# Patient Record
Sex: Female | Born: 1995 | Race: White | Hispanic: No | Marital: Single | Smoking: Never smoker
Health system: Southern US, Community
[De-identification: ages and names within clinical notes are randomized; demographics above are authoritative.]

## PROBLEM LIST (undated history)

## (undated) DIAGNOSIS — J309 Allergic rhinitis, unspecified: Secondary | ICD-10-CM

## (undated) HISTORY — PX: TONSILLECTOMY: SUR1361

## (undated) HISTORY — PX: ADENOIDECTOMY: SUR15

## (undated) HISTORY — DX: Allergic rhinitis, unspecified: J30.9

---

## 2014-12-07 ENCOUNTER — Inpatient Hospital Stay
Admission: EM | Admit: 2014-12-07 | Discharge: 2014-12-14 | DRG: 200 | Disposition: A | Discharge status: Other Acute Inpatient Hospital | Payer: BLUE CROSS/BLUE SHIELD | Attending: Cardiothoracic Surgery | Admitting: Cardiothoracic Surgery

## 2014-12-07 ENCOUNTER — Emergency Department: Payer: BLUE CROSS/BLUE SHIELD

## 2014-12-07 ENCOUNTER — Encounter: Payer: Self-pay | Admitting: Emergency Medicine

## 2014-12-07 DIAGNOSIS — J9383 Other pneumothorax: Secondary | ICD-10-CM | POA: Diagnosis not present

## 2014-12-07 DIAGNOSIS — J9311 Primary spontaneous pneumothorax: Principal | ICD-10-CM | POA: Insufficient documentation

## 2014-12-07 DIAGNOSIS — J020 Streptococcal pharyngitis: Secondary | ICD-10-CM | POA: Diagnosis present

## 2014-12-07 DIAGNOSIS — J939 Pneumothorax, unspecified: Secondary | ICD-10-CM

## 2014-12-07 DIAGNOSIS — Y838 Other surgical procedures as the cause of abnormal reaction of the patient, or of later complication, without mention of misadventure at the time of the procedure: Secondary | ICD-10-CM | POA: Diagnosis not present

## 2014-12-07 DIAGNOSIS — R509 Fever, unspecified: Secondary | ICD-10-CM

## 2014-12-07 DIAGNOSIS — J95812 Postprocedural air leak: Secondary | ICD-10-CM | POA: Diagnosis not present

## 2014-12-07 DIAGNOSIS — J9811 Atelectasis: Secondary | ICD-10-CM | POA: Diagnosis not present

## 2014-12-07 DIAGNOSIS — Z79899 Other long term (current) drug therapy: Secondary | ICD-10-CM

## 2014-12-07 LAB — CBC
HEMATOCRIT: 42.8 % (ref 35.0–47.0)
HEMOGLOBIN: 14.5 g/dL (ref 12.0–16.0)
MCH: 30.4 pg (ref 26.0–34.0)
MCHC: 33.9 g/dL (ref 32.0–36.0)
MCV: 89.7 fL (ref 80.0–100.0)
Platelets: 344 10*3/uL (ref 150–440)
RBC: 4.77 MIL/uL (ref 3.80–5.20)
RDW: 12.4 % (ref 11.5–14.5)
WBC: 16 10*3/uL — AB (ref 3.6–11.0)

## 2014-12-07 LAB — BASIC METABOLIC PANEL
ANION GAP: 7 (ref 5–15)
BUN: 12 mg/dL (ref 6–20)
CO2: 27 mmol/L (ref 22–32)
Calcium: 9.5 mg/dL (ref 8.9–10.3)
Chloride: 105 mmol/L (ref 101–111)
Creatinine, Ser: 0.75 mg/dL (ref 0.44–1.00)
GFR calc non Af Amer: >60 mL/min (ref >60)
GLUCOSE: 80 mg/dL (ref 65–99)
POTASSIUM: 4.2 mmol/L (ref 3.5–5.1)
Sodium: 139 mmol/L (ref 135–145)

## 2014-12-07 LAB — TROPONIN I: Troponin I: <0.03 ng/mL (ref <0.031)

## 2014-12-07 MED ORDER — LIDOCAINE-EPINEPHRINE (PF) 1 %-1:200000 IJ SOLN
INTRAMUSCULAR | Status: AC
  Administered 2014-12-07: 30 mL via INTRADERMAL
  Filled 2014-12-07: qty 30

## 2014-12-07 MED ORDER — LIDOCAINE-EPINEPHRINE (PF) 1 %-1:200000 IJ SOLN
30.0000 mL | Freq: Once | INTRAMUSCULAR | Status: AC
  Administered 2014-12-07 (×2): 30 mL via INTRADERMAL

## 2014-12-07 MED ORDER — ONDANSETRON HCL 4 MG/2ML IJ SOLN
INTRAMUSCULAR | Status: AC
  Administered 2014-12-07: 4 mg via INTRAVENOUS
  Filled 2014-12-07: qty 2

## 2014-12-07 MED ORDER — MORPHINE SULFATE (PF) 4 MG/ML IV SOLN
4.0000 mg | Freq: Once | INTRAVENOUS | Status: AC
  Administered 2014-12-07: 4 mg via INTRAVENOUS

## 2014-12-07 MED ORDER — MORPHINE SULFATE (PF) 4 MG/ML IV SOLN
INTRAVENOUS | Status: AC
  Administered 2014-12-07: 4 mg via INTRAVENOUS
  Filled 2014-12-07: qty 1

## 2014-12-07 MED ORDER — ONDANSETRON HCL 4 MG/2ML IJ SOLN
4.0000 mg | Freq: Once | INTRAMUSCULAR | Status: AC
  Administered 2014-12-07: 4 mg via INTRAVENOUS

## 2014-12-07 MED ORDER — MORPHINE SULFATE (PF) 2 MG/ML IV SOLN
2.0000 mg | Freq: Once | INTRAVENOUS | Status: AC
  Administered 2014-12-07: 2 mg via INTRAVENOUS

## 2014-12-07 MED ORDER — MORPHINE SULFATE (PF) 2 MG/ML IV SOLN
INTRAVENOUS | Status: AC
  Administered 2014-12-07: 2 mg via INTRAVENOUS
  Filled 2014-12-07: qty 1

## 2014-12-07 NOTE — ED Notes (Signed)
X-ray at bedside

## 2014-12-07 NOTE — ED Provider Notes (Signed)
[facility] Emergency Department Provider Note  ____________________________________________  Time seen: 10:50 PM  I have reviewed the triage vital signs and the nursing notes.   HISTORY  Chief Complaint Chest Pain      HPI Judy Barker is a 19 y.o. female recess acute onset of less shoulder pain and shortness of breath at approximate 4:30 PM while studying. Patient denies any nausea no vomiting. Patient states that current pain score is 7 out of 10 described as sharp. Patient denies any family history of cardiac disease patient also denies any family history of spontaneous pneumothorax.   Past medical history None There are no active problems to display for this patient.   Past surgical history None  Current Outpatient Rx  Name  Route  Sig  Dispense  Refill  . dexmethylphenidate (FOCALIN XR) 10 MG 24 hr capsule   Oral   Take 1 capsule by mouth daily.      0   . methylphenidate 18 MG PO CR tablet   Oral   Take 1 tablet by mouth daily.      0     Allergies No known drug allergies No family history on file.  Social History Social History  Substance Use Topics  . Smoking status: Never Smoker   . Smokeless tobacco: None  . Alcohol Use: Yes    Review of Systems  Constitutional: Negative for fever. Eyes: Negative for visual changes. ENT: Negative for sore throat. Cardiovascular: Positive for chest pain. Respiratory: Positive for shortness of breath. Gastrointestinal: Negative for abdominal pain, vomiting and diarrhea. Genitourinary: Negative for dysuria. Musculoskeletal: Negative for back pain. Skin: Negative for rash. Neurological: Negative for headaches, focal weakness or numbness.   10-point ROS otherwise negative.  ____________________________________________   PHYSICAL EXAM:  VITAL SIGNS: ED Triage Vitals  Enc Vitals Group     BP 12/07/14 2209 146/105 mmHg     Pulse Rate 12/07/14 2209 113     Resp 12/07/14 2209  22     Temp 12/07/14 2209 98.1 F (36.7 C)     Temp Source 12/07/14 2209 Oral     SpO2 12/07/14 2209 99 %     Weight 12/07/14 2209 13 lb (5.897 kg)     Height 12/07/14 2209  (1.727 m)     Head Cir --      Peak Flow --      Pain Score 12/07/14 2210 6     Pain Loc --      Pain Edu? --      Excl. in GC? --      Constitutional: Alert and oriented. Well appearing and in no distress. Eyes: Conjunctivae are normal. PERRL. Normal extraocular movements. ENT   Head: Normocephalic and atraumatic.   Nose: No congestion/rhinnorhea.   Mouth/Throat: Mucous membranes are moist.   Neck: No stridor. Hematological/Lymphatic/Immunilogical: No cervical lymphadenopathy. Cardiovascular: Normal rate, regular rhythm. Normal and symmetric distal pulses are present in all extremities. No murmurs, rubs, or gallops. Respiratory: Normal respiratory effort without tachypnea nor retractions. Breath sounds are clear and equal bilaterally. No wheezes/rales/rhonchi. Gastrointestinal: Soft and nontender. No distention. There is no CVA tenderness. Genitourinary: deferred Musculoskeletal: Nontender with normal range of motion in all extremities. No joint effusions.  No lower extremity tenderness nor edema. Neurologic:  Normal speech and language. No gross focal neurologic deficits are appreciated. Speech is normal.  Skin:  Skin is warm, dry and intact. No rash noted. Psychiatric: Mood and affect are normal. Speech and behavior are  normal. Patient exhibits appropriate insight and judgment.  ____________________________________________     EKG  ED ECG REPORT I, Eligha Kmetz, Colma N, the attending physician, personally viewed and interpreted this ECG.   Date: 12/07/2014  EKG Time: 10:10 PM   Rate: 116  Rhythm: Sinus tachycardia  Axis: None  Intervals: Normal  ST&T Change: None   ____________________________________________    RADIOLOGY    DG Chest 2 View (Final result) Result time:  12/07/14 22:56:54   Final result by Rad Results In Interface (12/07/14 22:56:54)   Narrative:   CLINICAL DATA: Left-sided chest pain, left arm numbness, and shortness of breath.  EXAM: CHEST 2 VIEW  COMPARISON: None.  FINDINGS: Moderate-sized left pneumothorax with mild volume loss in the left lung. Right lung is clear and expanded. Normal heart size and pulmonary vascularity. No blunting of costophrenic angles. Mediastinal contours appear intact.  IMPRESSION: Moderate size left pneumothorax. No tension.  These results were called by telephone at the time of interpretation on 12/07/2014 at 10:53 pm to Dr. Loleta Rose , who verbally acknowledged these results.   Electronically Signed By: Burman Nieves M.D. On: 12/07/2014 22:56         INITIAL IMPRESSION / ASSESSMENT AND PLAN / ED COURSE  Pertinent labs & imaging results that were available during my care of the patient were reviewed by me and considered in my medical decision making (see chart for details).  Patient discussed with Dr. Excell Seltzer general surgeon on call who placed a 28 French chest tube in the left chest cavity.  ____________________________________________   FINAL CLINICAL IMPRESSION(S) / ED DIAGNOSES  Final diagnoses:  Spontaneous pneumothorax      Darci Current, MD 12/07/14 2340

## 2014-12-07 NOTE — ED Notes (Signed)
MD Brown at bedside, completing medical evaluation.  

## 2014-12-07 NOTE — ED Notes (Addendum)
MD Excell Seltzer at bedside placing Chest Tube at this time.

## 2014-12-07 NOTE — ED Notes (Signed)
Pt presents to ED with left sided chest pain and left arm numbness and slight shortness of breath. Reports feeling tight in her shoulders as well. symptoms started around 1630 while studying and have gotten progressively worse. Pt tearful and appears anxious. No hx of the same.

## 2014-12-07 NOTE — ED Notes (Signed)
MD Manson Passey at bedside, updating pt on current plan of care.

## 2014-12-08 ENCOUNTER — Encounter: Payer: Self-pay | Admitting: Emergency Medicine

## 2014-12-08 DIAGNOSIS — J9311 Primary spontaneous pneumothorax: Principal | ICD-10-CM

## 2014-12-08 DIAGNOSIS — J020 Streptococcal pharyngitis: Secondary | ICD-10-CM | POA: Diagnosis present

## 2014-12-08 DIAGNOSIS — Y838 Other surgical procedures as the cause of abnormal reaction of the patient, or of later complication, without mention of misadventure at the time of the procedure: Secondary | ICD-10-CM | POA: Diagnosis not present

## 2014-12-08 DIAGNOSIS — J9811 Atelectasis: Secondary | ICD-10-CM | POA: Diagnosis not present

## 2014-12-08 DIAGNOSIS — J939 Pneumothorax, unspecified: Secondary | ICD-10-CM | POA: Diagnosis present

## 2014-12-08 DIAGNOSIS — J9383 Other pneumothorax: Secondary | ICD-10-CM | POA: Insufficient documentation

## 2014-12-08 DIAGNOSIS — Z79899 Other long term (current) drug therapy: Secondary | ICD-10-CM | POA: Diagnosis not present

## 2014-12-08 DIAGNOSIS — J95812 Postprocedural air leak: Secondary | ICD-10-CM | POA: Diagnosis not present

## 2014-12-08 MED ORDER — MORPHINE SULFATE (PF) 2 MG/ML IV SOLN
2.0000 mg | INTRAVENOUS | Status: DC | PRN
  Administered 2014-12-08 – 2014-12-10 (×9): 2 mg via INTRAVENOUS
  Filled 2014-12-08 (×9): qty 1

## 2014-12-08 MED ORDER — ONDANSETRON HCL 4 MG PO TABS: 4.0000 mg | ORAL_TABLET | Freq: Four times a day (QID) | ORAL | Status: DC | PRN

## 2014-12-08 MED ORDER — SODIUM CHLORIDE 0.9 % IJ SOLN: 3.0000 mL | INTRAMUSCULAR | Status: DC | PRN

## 2014-12-08 MED ORDER — SODIUM CHLORIDE 0.9 % IJ SOLN
3.0000 mL | Freq: Two times a day (BID) | INTRAMUSCULAR | Status: DC
  Administered 2014-12-08 – 2014-12-13 (×12): 3 mL via INTRAVENOUS

## 2014-12-08 MED ORDER — HYDROCODONE-ACETAMINOPHEN 5-325 MG PO TABS
1.0000 | ORAL_TABLET | ORAL | Status: DC | PRN
  Administered 2014-12-08: 1 via ORAL
  Administered 2014-12-09: 2 via ORAL
  Administered 2014-12-09 (×2): 1 via ORAL
  Administered 2014-12-10 (×2): 2 via ORAL
  Administered 2014-12-11 (×2): 1 via ORAL
  Administered 2014-12-11 (×2): 2 via ORAL
  Administered 2014-12-12: 1 via ORAL
  Administered 2014-12-12 (×2): 2 via ORAL
  Administered 2014-12-12 – 2014-12-13 (×3): 1 via ORAL
  Filled 2014-12-08: qty 1
  Filled 2014-12-08 (×2): qty 2
  Filled 2014-12-08 (×3): qty 1
  Filled 2014-12-08: qty 2
  Filled 2014-12-08: qty 1
  Filled 2014-12-08 (×2): qty 2
  Filled 2014-12-08 (×3): qty 1
  Filled 2014-12-08 (×2): qty 2
  Filled 2014-12-08: qty 1
  Filled 2014-12-08: qty 2
  Filled 2014-12-08: qty 1

## 2014-12-08 MED ORDER — SODIUM CHLORIDE 0.9 % IV SOLN: 250.0000 mL | INTRAVENOUS | Status: DC | PRN

## 2014-12-08 MED ORDER — ONDANSETRON HCL 4 MG/2ML IJ SOLN
4.0000 mg | Freq: Four times a day (QID) | INTRAMUSCULAR | Status: DC | PRN
  Administered 2014-12-08: 4 mg via INTRAVENOUS
  Filled 2014-12-08: qty 2

## 2014-12-08 NOTE — Progress Notes (Signed)
Patient ID: Judy Barker, female   DOB: Dec 05, 1995, 19 y.o.   MRN: [id number]  Chief Complaint  Patient presents with  . Chest Pain    Referred By Dr. Egbert Garibaldi  Reason for Referral Management of left spontaneous pneumothorax  HPI Location, Quality, Duration, Severity, Timing, Context, Modifying Factors, Associated Signs and Symptoms.  Judy Barker is a 19 y.o. female.  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  No strenuous physical exertion.  Went about her usual routine until the pain became severe and she went to the ER where a CXRay showed a moderate sized left pneumothorax which resolved after chest tube insertion.  Today, she is not short of breath and there is almost no pain.  Never had this before.  Nonsmoker.  No family history of pneumothorax.  Lat menses was about 2 weeks ago.  Regular periods.   History reviewed. No pertinent past medical history.  History reviewed. No pertinent past surgical history.  History reviewed. No pertinent family history.  Social History Social History  Substance Use Topics  . Smoking status: Never Smoker   . Smokeless tobacco: None  . Alcohol Use: 1.8 - 3.0 oz/week    2-3 Glasses of wine, 1-2 Cans of beer, 0 Shots of liquor per week    No Known Allergies  Current Facility-Administered Medications  Medication Dose Route Frequency Provider Last Rate Last Dose  . 0.9 %  sodium chloride infusion  250 mL Intravenous PRN Lattie Haw, MD      . HYDROcodone-acetaminophen (NORCO/VICODIN) 5-325 MG per tablet 1-2 tablet  1-2 tablet Oral Q4H PRN Lattie Haw, MD      . morphine 2 MG/ML injection 2 mg  2 mg Intravenous Q2H PRN Lattie Haw, MD   2 mg at 12/08/14 0837  . ondansetron (ZOFRAN) tablet 4 mg  4 mg Oral Q6H PRN Lattie Haw, MD       Or  . ondansetron [facility]) injection 4 mg  4 mg Intravenous Q6H PRN Lattie Haw, MD      . sodium  chloride 0.9 % injection 3 mL  3 mL Intravenous Q12H Lattie Haw, MD   3 mL at 12/08/14 0248  . sodium chloride 0.9 % injection 3 mL  3 mL Intravenous PRN Lattie Haw, MD          Review of Systems A complete review of systems was asked and was negative except for the following positive findings some pain at chest tube insertion site.  Blood pressure 142/89, pulse 101, temperature 98.8 F (37.1 C), temperature source Oral, resp. rate 20, height  (1.727 m), weight 137 lb 6.4 oz (62.324 kg), last menstrual period 11/13/2014, SpO2 100 %.  Physical Exam CONSTITUTIONAL:  Pleasant, well-developed, well-nourished, and in no acute distress. EYES: Pupils equal and reactive to light, Sclera non-icteric EARS, NOSE, MOUTH AND THROAT:  The oropharynx was clear.  Dentition is good repair.  Oral mucosa pink and moist. LYMPH NODES:  Lymph nodes in the neck and axillae were normal RESPIRATORY:  Lungs were clear.  Normal respiratory effort without pathologic use of accessory muscles of respiration CARDIOVASCULAR: Heart was regular without murmurs.  There were no carotid bruits. GI: The abdomen was soft, nontender, and nondistended. There were no palpable masses. There was no hepatosplenomegaly. There were normal bowel sounds in all quadrants. Musculoskeletal:  No clubbing or cyanosis.  SKIN:  There were no pathologic skin lesions.  There were no nodules on palpation. NEUROLOGIC:  Sensation is normal.  Cranial nerves are grossly intact. PSYCH:  Oriented to person, place and time.  Mood and affect are normal.  Data Reviewed CXRays  I have personally reviewed the patient's imaging, laboratory findings and medical records.    Assessment    Left spontaneous pneumothorax      Plan    There is no air leak today.  Will leave on suction today and repeat CXRay in the morning.  If there is no air leak and no pneumothorax, we will place the tube to water seal.  May removed chest tube tomorrow.           Hulda Marinimothy Haru Anspaugh, MD 12/08/2014, 11:48 AM

## 2014-12-08 NOTE — Op Note (Signed)
*   No surgery found *  12:09 AM  PATIENT:  Judy Barker  19 y.o. female  PRE-OPERATIVE DIAGNOSIS:  Left pneumothorax  POST-OPERATIVE DIAGNOSIS:  Same  PROCEDURE: Left chest tube placement  SURGEON:  Lattie Hawichard E Cooper MD, FACS   ANESTHESIA:   local   Details of Procedure: This patient with findings of a left pneumothorax spontaneous in nature. Wires emergency chest tube placement on the left.  Informed consent was obtained while discussing the risks of bleeding infection recurrence the need for further surgery and also the placement of the tube itself. This is all reviewed for her she understood and agreed to proceed.  A time out was held. She was then prepped and draped in sterile fashion local anesthetic was infiltrated into the skin and subcutaneous tissues tissues around the left chest wall near the anterior to midaxillary line on the left. The local anesthetic needle was advanced into the pleural cavity signifying and identifying the presence of air in the pleural cavity.  An incision was made and blunt dissection with a hemostat was performed open the chest then the trocar 20 French catheter was placed easily and advanced after confirming that there was air in the pleural cavity. The chest tube was advanced and then sewn in with the provided silk suture and a sterile dressing was placed  Tube was placed to 20 cm of negative suction and there was no obvious air leak.  Postprocedure chest film demonstrated good inflation of the lung on the left with a chest tube in proper position. She was left in stable condition in the emergency room.   Lattie Hawichard E Cooper, MD FACS

## 2014-12-08 NOTE — H&P (Signed)
Judy Barker is an 19 y.o. female.    Chief Complaint: Left chest pain  HPI: This patient experienced the acute onset of left chest pain while studying there is no history of trauma or exertion she's had left chest and left neck pain as well as some shortness of breath. In the emergency room showed a considerable sized left pneumothorax and I was asked see the patient for chest tube placement. I discussed the patient directly with Dr. Owens Shark.  History reviewed. No pertinent past medical history.  History reviewed. No pertinent past surgical history.  No family history on file. Social History:  reports that she has never smoked. She does not have any smokeless tobacco history on file. She reports that she drinks alcohol. She reports that she does not use illicit drugs.  Allergies: No Known Allergies   (Not in a hospital admission)   Review of Systems  Constitutional: Negative.   HENT: Negative.   Eyes: Negative.   Respiratory: Positive for shortness of breath. Negative for cough, hemoptysis, sputum production and wheezing.   Cardiovascular: Positive for chest pain. Negative for palpitations, orthopnea, claudication and leg swelling.  Gastrointestinal: Negative.   Genitourinary: Negative.   Musculoskeletal: Negative.   Skin: Negative.   Neurological: Negative.   Endo/Heme/Allergies: Negative.   Psychiatric/Behavioral: Negative.      Physical Exam:  BP 169/94 mmHg  Pulse 112  Temp(Src) 98.1 F (36.7 C) (Oral)  Resp 14  Ht 5' 8"  (1.727 m)  Wt 13 lb (5.897 kg)  BMI 1.98 kg/m2  SpO2 100%  LMP 11/13/2014  Physical Exam  Constitutional: She is oriented to person, place, and time and well-developed, well-nourished, and in no distress. No distress.  HENT:  Head: Normocephalic and atraumatic.  Eyes: Pupils are equal, round, and reactive to light. No scleral icterus.  Neck: Normal range of motion.  Cardiovascular: Normal rate, regular rhythm and normal heart sounds.    Pulmonary/Chest: Effort normal. No respiratory distress. She has no wheezes. She has no rales. She exhibits no tenderness.  Near absent left breath sounds  Abdominal: Soft. She exhibits no distension. There is no tenderness.  Musculoskeletal: Normal range of motion. She exhibits no edema.  Lymphadenopathy:    She has no cervical adenopathy.  Neurological: She is alert and oriented to person, place, and time.  Skin: Skin is warm and dry.  Psychiatric: Mood and affect normal.        Results for orders placed or performed during the hospital encounter of 12/07/14 (from the past 48 hour(s))  CBC     Status: Abnormal   Collection Time: 12/07/14 10:34 PM  Result Value Ref Range   WBC 16.0 (H) 3.6 - 11.0 K/uL   RBC 4.77 3.80 - 5.20 MIL/uL   Hemoglobin 14.5 12.0 - 16.0 g/dL   HCT 42.8 35.0 - 47.0 %   MCV 89.7 80.0 - 100.0 fL   MCH 30.4 26.0 - 34.0 pg   MCHC 33.9 32.0 - 36.0 g/dL   RDW 12.4 11.5 - 14.5 %   Platelets 344 150 - 440 K/uL  Basic metabolic panel     Status: None   Collection Time: 12/07/14 10:34 PM  Result Value Ref Range   Sodium 139 135 - 145 mmol/L   Potassium 4.2 3.5 - 5.1 mmol/L   Chloride 105 101 - 111 mmol/L   CO2 27 22 - 32 mmol/L   Glucose, Bld 80 65 - 99 mg/dL   BUN 12 6 - 20 mg/dL  Creatinine, Ser 0.75 0.44 - 1.00 mg/dL   Calcium 9.5 8.9 - 10.3 mg/dL   GFR calc non Af Amer >60 >60 mL/min   GFR calc Af Amer >60 >60 mL/min    Comment: (NOTE) The eGFR has been calculated using the CKD EPI equation. This calculation has not been validated in all clinical situations. eGFR's persistently <60 mL/min signify possible Chronic Kidney Disease.    Anion gap 7 5 - 15  Troponin I     Status: None   Collection Time: 12/07/14 10:34 PM  Result Value Ref Range   Troponin I <0.03 <0.031 ng/mL    Comment:        NO INDICATION OF MYOCARDIAL INJURY.    Dg Chest 2 View  12/07/2014   CLINICAL DATA:  Left-sided chest pain, left arm numbness, and shortness of breath.   EXAM: CHEST  2 VIEW  COMPARISON:  None.  FINDINGS: Moderate-sized left pneumothorax with mild volume loss in the left lung. Right lung is clear and expanded. Normal heart size and pulmonary vascularity. No blunting of costophrenic angles. Mediastinal contours appear intact.  IMPRESSION: Moderate size left pneumothorax.  No tension.  These results were called by telephone at the time of interpretation on 12/07/2014 at 10:53 pm to Dr. Hinda Kehr , who verbally acknowledged these results.   Electronically Signed   By: Lucienne Capers M.D.   On: 12/07/2014 22:56   Dg Chest Portable 1 View  12/07/2014   CLINICAL DATA:  Chest tube placement.  EXAM: PORTABLE CHEST 1 VIEW  COMPARISON:  Chest radiograph December 07, 2014 at 2241 hours.  FINDINGS: Interval placement of LEFT chest tube, distal tip projecting and apex, side port within the chest wall. Tiny residual LEFT apical pneumothorax, improved. No mediastinal shift shift. Cardiomediastinal silhouette is normal. No pleural effusion focal consolidation. Soft tissue planes and included osseous structures are nonsuspicious.  IMPRESSION: Interval placement of LEFT chest tube, tiny residual LEFT apical pneumothorax without mediastinal shift.   Electronically Signed   By: Elon Alas M.D.   On: 12/07/2014 23:58     Assessment/Plan  Chest x-rays personally reviewed. Considerable size left pneumothorax. He requires emergency chest tube placement C separate dictation. Patient will be admitted to the hospital post chest tube placement.  Florene Glen, MD, FACS

## 2014-12-09 ENCOUNTER — Inpatient Hospital Stay: Payer: BLUE CROSS/BLUE SHIELD

## 2014-12-09 LAB — CBC
HCT: 39.9 % (ref 35.0–47.0)
HEMOGLOBIN: 13.3 g/dL (ref 12.0–16.0)
MCH: 30.5 pg (ref 26.0–34.0)
MCHC: 33.2 g/dL (ref 32.0–36.0)
MCV: 91.7 fL (ref 80.0–100.0)
PLATELETS: 239 10*3/uL (ref 150–440)
RBC: 4.36 MIL/uL (ref 3.80–5.20)
RDW: 12.4 % (ref 11.5–14.5)
WBC: 21.1 10*3/uL — ABNORMAL HIGH (ref 3.6–11.0)

## 2014-12-09 MED ORDER — DEXTROSE 5 % IV SOLN
1.5000 g | Freq: Two times a day (BID) | INTRAVENOUS | Status: DC
  Administered 2014-12-09 – 2014-12-12 (×6): 1.5 g via INTRAVENOUS
  Filled 2014-12-09 (×8): qty 1.5

## 2014-12-09 MED ORDER — ACETAMINOPHEN 325 MG PO TABS
650.0000 mg | ORAL_TABLET | ORAL | Status: DC | PRN
  Administered 2014-12-09 – 2014-12-10 (×3): 650 mg via ORAL
  Filled 2014-12-09 (×4): qty 2

## 2014-12-09 MED ORDER — MENTHOL 3 MG MT LOZG
1.0000 | LOZENGE | OROMUCOSAL | Status: DC | PRN
  Administered 2014-12-10: 3 mg via ORAL
  Filled 2014-12-09: qty 9

## 2014-12-09 MED ORDER — CEPHALEXIN 250 MG PO CAPS
250.0000 mg | ORAL_CAPSULE | Freq: Three times a day (TID) | ORAL | Status: DC
  Administered 2014-12-09: 250 mg via ORAL
  Filled 2014-12-09: qty 1

## 2014-12-09 NOTE — Progress Notes (Signed)
[facility] Post-Op Note  Patient ID: Judy Barker, female   DOB: 1996-02-16, 19 y.o.   MRN: [id number]  HISTORY: Some apin at chest tube site.  Not short of breath.  Coughing some this morning.  No fever.  Walking in halls.   Filed Vitals:   12/09/14 0830  BP: 120/63  Pulse: 86  Temp: 98.9 F (37.2 C)  Resp: 16     EXAM: Resp: Lungs are clear bilaterally.  No respiratory distress, normal effort. Heart:  Regular without murmurs Skin: Skin is warm and dry. No rash noted. No diaphoretic. No erythema. No pallor.  Psychiatric: Normal mood and affect. Normal behavior. Judgment and thought content normal.    ASSESSMENT: Independent review of CXray shows a small apical pneumothorax that is slightly larger.  There is a small air leak with cough   PLAN:   Will leave on water seal today.   Repeat CXRay in the morning    Hulda Marinimothy Shawndell Schillaci, MD

## 2014-12-10 ENCOUNTER — Inpatient Hospital Stay: Payer: BLUE CROSS/BLUE SHIELD

## 2014-12-10 DIAGNOSIS — J939 Pneumothorax, unspecified: Secondary | ICD-10-CM

## 2014-12-10 LAB — URINALYSIS COMPLETE WITH MICROSCOPIC (ARMC ONLY)
BACTERIA UA: NONE SEEN
BILIRUBIN URINE: NEGATIVE
GLUCOSE, UA: NEGATIVE mg/dL
Hgb urine dipstick: NEGATIVE
Ketones, ur: NEGATIVE mg/dL
Leukocytes, UA: NEGATIVE
NITRITE: NEGATIVE
PH: 6 (ref 5.0–8.0)
PROTEIN: NEGATIVE mg/dL
Specific Gravity, Urine: 1.005 (ref 1.005–1.030)

## 2014-12-10 NOTE — Consult Note (Signed)
Medical Consultation  Earl ManyMorgan Sappenfield ZOX:[id number] DOB: February 16, 1996 DOA: 12/07/2014 PCP: No PCP Per Patient   Requesting physician: Dr Thelma Bargeoaks Date of consultation: 12/10/2014  Reason for consultation: fver  Impression/Recommendations 1. Fever: Possibly from atelectasis vs PTX vs PNA vs Strep throat Continue current broad spectrum antibiotics for now Follow up on blood cx Check UA with Ucx Follow up on strep throat cx If still with persistent fever, needs ID consult and CT scan CHEST ISS for Atelectasis   2. Sponataneous PTX: per surgery It appears from today's XRAY unchanged/increaed slightly    Chief Complaint: fever  HPI:  19 year- old female who presented on October 12 with chest pain and found to have pneumothorax. Hospital service was consulted due to persistent fever. Patient had a temperature 100.7 yesterday. She is a temperature of 99.5 currently. Patient has a cough. She reports approximate 2 weeks ago she had a URI with symptoms of cough, congestion and runny nose. She does report that she gets strep throat very often and does not have classical symptoms. Should the culture has been ordered and is pending at this time. She has no urinary symptoms. Patient currently has a chest tube laced for her pneumothorax.  Review of Systems  Constitutional: ++ fever, chills  No weight loss HENT: Negative for ear pain, nosebleeds, congestion, facial swelling, rhinorrhea, neck pain, neck stiffness and ear discharge.   Respiratory: ++ cough, shortness of breath, NOwheezing  Cardiovascular: Negative for chest pain, palpitations and leg swelling.  Gastrointestinal: Negative for heartburn, abdominal pain, vomiting, diarrhea or consitpation Genitourinary: Negative for dysuria, urgency, frequency, hematuria Musculoskeletal: Negative for back pain or joint pain Neurological: Negative for dizziness, seizures, syncope, focal weakness,  numbness and headaches.  Hematological: Does not  bruise/bleed easily.  Psychiatric/Behavioral: Negative for hallucinations, confusion, dysphoric mood    PMHX: none P Surgical Hx: none  Social History:  reports that she has never smoked. She does not have any smokeless tobacco history on file. She reports that she drinks about 1.8 - 3.0 oz of alcohol per week. She reports that she does not use illicit drugs.  No Known Allergies   FAMILY HX: no CAD.HTN  Prior to Admission medications   Medication Sig Start Date End Date Taking? Authorizing Provider  dexmethylphenidate (FOCALIN XR) 10 MG 24 hr capsule Take 1 capsule by mouth daily. 12/03/14  Yes Historical Provider, MD  methylphenidate 18 MG PO CR tablet Take 1 tablet by mouth daily. 11/19/14  Yes Historical Provider, MD    Physical Exam: Blood pressure 119/67, pulse 94, temperature 99.5 F (37.5 C), temperature source Oral, resp. rate 16, height 5\' 8"  (1.727 m), weight 62.324 kg (137 lb 6.4 oz), last menstrual period 11/13/2014, SpO2 98 %. @VITALS2 @ [facility]   12/07/14 2209 12/08/14 0058  Weight: 5.897 kg (13 lb) 62.324 kg (137 lb 6.4 oz)    Intake/Output Summary (Last 24 hours) at 12/10/14 1120 Last data filed at 12/10/14 0830  Gross per 24 hour  Intake    720 ml  Output      0 ml  Net    720 ml     Constitutional: Appears well-developed and well-nourished. No distress. HENT: Normocephalic. Marland Kitchen. Oropharynx is clear and moist.  Eyes: Conjunctivae and EOM are normal. PERRLA, no scleral icterus.  Neck: Normal ROM. Neck supple. No JVD. No tracheal deviation. CVS: RRR, S1/S2 +, no murmurs, no gallops, no carotid bruit.  Pulmonary: Effort and breath sounds normal, no stridor, rhonchi, wheezes, rales. She has chest  tube left chest wall Abdominal: Soft. BS +,  no distension, tenderness, rebound or guarding.  Musculoskeletal: Normal range of motion. No edema and no tenderness.  Neuro: Alert. CN 2-12 grossly intact. No focal deficits. Skin: Skin is warm and dry. No rash  noted. Psychiatric: Normal mood and affect.    Labs  Basic Metabolic Panel:  Recent Labs Lab 12/07/14 2234  NA 139  K 4.2  CL 105  CO2 27  GLUCOSE 80  BUN 12  CREATININE 0.75  CALCIUM 9.5   Liver Function Tests: No results for input(s): AST, ALT, ALKPHOS, BILITOT, PROT, ALBUMIN in the last 168 hours. No results for input(s): LIPASE, AMYLASE in the last 168 hours.  CBC:  Recent Labs Lab 12/09/14 1710  WBC 21.1*  HGB 13.3  HCT 39.9  MCV 91.7  PLT 239   Cardiac Enzymes:  Recent Labs Lab 12/07/14 2234  TROPONINI <0.03   BNP: Invalid input(s): POCBNP CBG: No results for input(s): GLUCAP in the last 168 hours.  Radiological Exams: Dg Chest 2 View  12/10/2014  CLINICAL DATA:  Left-sided pneumothorax. EXAM: CHEST  2 VIEW COMPARISON:  December 09, 2014. FINDINGS: The heart size and mediastinal contours are within normal limits. Right lung is clear. Left-sided chest tube is again noted and unchanged in position. Mild left apical pneumothorax is noted which is increased slightly in size. The visualized skeletal structures are unremarkable. IMPRESSION: Left-sided chest tube is unchanged in position. Left apical pneumothorax has increased slightly in size since prior exam. Electronically Signed   By: Lupita Raider, M.D.   On: 12/10/2014 10:03   Dg Chest 2 View  12/09/2014  CLINICAL DATA:  Fever. EXAM: CHEST  2 VIEW COMPARISON:  December 09, 2014 FINDINGS: The heart size and mediastinal contours are within normal limits. A left chest tube is identified. Left apical pneumothorax is unchanged compared prior exam. The lungs are otherwise clear. The visualized skeletal structures are stable. IMPRESSION: Left chest tube is unchanged. Left apical pneumothorax is unchanged compared to prior earlier exam. No focal pneumonia is noted. Electronically Signed   By: Sherian Rein M.D.   On: 12/09/2014 18:09   Portable Chest 1 View  12/09/2014  CLINICAL DATA:  Followup pneumothorax  EXAM: PORTABLE CHEST 1 VIEW COMPARISON:  12/07/2014 FINDINGS: Left chest tube remains in place. Interval increase in left apical pneumothorax now 15%. No left effusion Left lower lobe airspace opacity is similar may represent pneumonia or atelectasis. Right lung remains clear IMPRESSION: Progression of left apical pneumothorax now 15%. No change in left lower lobe atelectasis/infiltrate. Electronically Signed   By: Marlan Palau M.D.   On: 12/09/2014 07:00       Thank you for allowing me to participate in the care of your patient. We will continue to follow.   Time spent: 45  Tullio Chausse, MD

## 2014-12-10 NOTE — Progress Notes (Signed)
Judy Barker Inpatient Post-Op Note  Patient ID: Judy Barker, female   DOB: [birth year], 19 y.o.   MRN: [id number]  HISTORY: She continues to have low-grade fevers. She also has shaking chills this morning. She complains of a sore throat but no shortness of breath or other symptoms. She did not eat as well as she would like yesterday but was able to walk several times in the halls. She continues to have some discomfort at the chest tube site.   Filed Vitals:   12/10/14 0742  BP:   Pulse:   Temp: 99 F (37.2 C)  Resp: 16     EXAM: Resp: Lungs are clear bilaterally.  No respiratory distress, normal effort. Heart:  Regular without murmurs Neurological: Alert and oriented to person, place, and time. Coordination normal.  Skin: Skin is warm and dry. No rash noted. No diaphoretic. No erythema. No pallor.  Psychiatric: Normal mood and affect. Normal behavior. Judgment and thought content normal.   I did redress all of her wounds yesterday in the afternoon. They look fine. There is no evidence of erythema or drainage. In addition her chest x-ray continues to show small pneumothorax without evidence of pneumonia or infiltrate.  ASSESSMENT: #1 left sided spontaneous pneumothorax with small air leak still present. We will continue to monitor for evidence of healing. #2 leukocytosis and fever. She is currently on Zosyn and appropriate cultures have been obtained including urine and blood. We will obtain a throat screen today. We will continue intravenous antibiotics and we will ask the hospitalist to see the patient form evaluation of her fever and leukocytosis.   PLAN:   Repeat chest x-ray today. Hospital consult for evaluation of leukocytosis and fever. Continue ambulation. Continue chest tube.    Hulda Marinimothy Kiona Blume, MD

## 2014-12-11 ENCOUNTER — Inpatient Hospital Stay: Payer: BLUE CROSS/BLUE SHIELD

## 2014-12-11 DIAGNOSIS — J9311 Primary spontaneous pneumothorax: Secondary | ICD-10-CM | POA: Insufficient documentation

## 2014-12-11 LAB — CBC
HCT: 35.9 % (ref 35.0–47.0)
Hemoglobin: 11.9 g/dL — ABNORMAL LOW (ref 12.0–16.0)
MCH: 30.2 pg (ref 26.0–34.0)
MCHC: 33.2 g/dL (ref 32.0–36.0)
MCV: 91 fL (ref 80.0–100.0)
PLATELETS: 230 10*3/uL (ref 150–440)
RBC: 3.94 MIL/uL (ref 3.80–5.20)
RDW: 12.6 % (ref 11.5–14.5)
WBC: 16.3 10*3/uL — AB (ref 3.6–11.0)

## 2014-12-11 NOTE — Progress Notes (Signed)
Notified Dr Egbert GaribaldiBird that bubbles in pt chest tube water chamber appeared larger than they were this morning; Dr acknowledged; no new orders

## 2014-12-11 NOTE — Progress Notes (Signed)
[facility] [place] [facility]   PATIENT NAME: Judy Barker    MR#:  [id number]  DATE OF BIRTH:  1995/12/04  SUBJECTIVE:  Doing well this point. No fevers overnight.  REVIEW OF SYSTEMS:    Review of Systems  Constitutional: Negative for fever, chills and malaise/fatigue.  HENT: Negative for sore throat.   Eyes: Negative for blurred vision.  Respiratory: Negative for cough, hemoptysis, shortness of breath and wheezing.   Cardiovascular: Negative for chest pain, palpitations and leg swelling.  Gastrointestinal: Negative for nausea, vomiting, abdominal pain, diarrhea and blood in stool.  Genitourinary: Negative for dysuria.  Musculoskeletal: Negative for back pain.  Neurological: Negative for dizziness, tremors and headaches.  Endo/Heme/Allergies: Does not bruise/bleed easily.    Tolerating Diet: Yes      DRUG ALLERGIES:  No Known Allergies  VITALS:  Blood pressure 110/58, pulse 77, temperature 97.6 F (36.4 C), temperature source Oral, resp. rate 16, height 5\' 8"  (1.727 m), weight 62.324 kg (137 lb 6.4 oz), last menstrual period 11/13/2014, SpO2 100 %.  PHYSICAL EXAMINATION:   Physical Exam  Constitutional: She is oriented to person, place, and time and well-developed, well-nourished, and in no distress. No distress.  HENT:  Head: Normocephalic.  Eyes: No scleral icterus.  Neck: Normal range of motion. Neck supple. No JVD present. No tracheal deviation present.  Cardiovascular: Normal rate, regular rhythm and normal heart sounds.  Exam reveals no gallop and no friction rub.   No murmur heard. Pulmonary/Chest: Effort normal and breath sounds normal. No respiratory distress. She has no wheezes. She has no rales. She exhibits no tenderness.  Left chest tube placed to suction  Abdominal: Soft. Bowel sounds are normal. She exhibits no distension and no mass. There is no tenderness. There is no rebound and no guarding.  Musculoskeletal:  Normal range of motion. She exhibits no edema.  Neurological: She is alert and oriented to person, place, and time.  Skin: Skin is warm. No rash noted. No erythema.  Psychiatric: Affect and judgment normal.      LABORATORY PANEL:   CBC  Recent Labs Lab 12/11/14 0854  WBC 16.3*  HGB 11.9*  HCT 35.9  PLT 230   ------------------------------------------------------------------------------------------------------------------  Chemistries   Recent Labs Lab 12/07/14 2234  NA 139  K 4.2  CL 105  CO2 27  GLUCOSE 80  BUN 12  CREATININE 0.75  CALCIUM 9.5   ------------------------------------------------------------------------------------------------------------------  Cardiac Enzymes  Recent Labs Lab 12/07/14 2234  TROPONINI <0.03   ------------------------------------------------------------------------------------------------------------------  RADIOLOGY:  Dg Chest 2 View  12/10/2014  CLINICAL DATA:  Left-sided pneumothorax. EXAM: CHEST  2 VIEW COMPARISON:  December 09, 2014. FINDINGS: The heart size and mediastinal contours are within normal limits. Right lung is clear. Left-sided chest tube is again noted and unchanged in position. Mild left apical pneumothorax is noted which is increased slightly in size. The visualized skeletal structures are unremarkable. IMPRESSION: Left-sided chest tube is unchanged in position. Left apical pneumothorax has increased slightly in size since prior exam. Electronically Signed   By: Lupita RaiderJames  Green Jr, M.D.   On: 12/10/2014 10:03   Dg Chest 2 View  12/09/2014  CLINICAL DATA:  Fever. EXAM: CHEST  2 VIEW COMPARISON:  December 09, 2014 FINDINGS: The heart size and mediastinal contours are within normal limits. A left chest tube is identified. Left apical pneumothorax is unchanged compared prior exam. The lungs are otherwise clear. The visualized skeletal structures are stable. IMPRESSION: Left chest tube is unchanged.  Left apical  pneumothorax is unchanged compared to prior earlier exam. No focal pneumonia is noted. Electronically Signed   By: Sherian Rein M.D.   On: 12/09/2014 18:09   Dg Chest Port 1 View  12/11/2014  CLINICAL DATA:  Respiratory failure EXAM: PORTABLE CHEST 1 VIEW COMPARISON:  12/10/2014 FINDINGS: Tiny left apical pneumothorax, decreased. Indwelling left chest tube. Right lung is clear.  No pleural effusions. The heart is normal in size. IMPRESSION: Tiny left apical pneumothorax, decreased. Indwelling left chest tube. Electronically Signed   By: Charline Bills M.D.   On: 12/11/2014 07:57     ASSESSMENT AND PLAN:   19 year old female who presented with spontaneous pneumothorax.  1. Fever: Patient has been afebrile for the past 24 hours. This is likely secondary to atelectasis. Continue her antibiotics and incentive spirometer.   2. Spontaneous pneumothorax: As per surgery.      Management plans discussed with the patient and she is in agreement.  CODE STATUS: Full  TOTAL TIME TAKING CARE OF THIS PATIENT: 24 minutes.    I will sign off please call if you have further questions thank you POSSIBLE D/C 2-3 days, DEPENDING ON CLINICAL CONDITION.   Alann Avey M.D on 12/11/2014 at 12:15 PM  Between 7am to 6pm - Pager - [phone] After 6pm go to [email or web address] - password [email or web address] [facility]  [facility]  Office  [phone]  CC: Primary care physician; No PCP Per Patient  Note: This dictation was prepared with Dragon dictation along with smaller phrase technology. Any transcriptional errors that result from this process are unintentional.

## 2014-12-11 NOTE — Progress Notes (Signed)
Patient ID: Judy Barker, female   DOB: 06/28/1995, 19 y.o.   MRN: [id number] [facility]   PATIENT NAME: Judy Barker    MR#:  [id number]  DATE OF BIRTH:  06/28/1995  SUBJECTIVE:  Less pain no sore throat, no fevers temps. Better cough and inspiration this am. Mother present.  REVIEW OF SYSTEMS:   Review of Systems  Constitutional: Negative for fever and chills.  Respiratory: Negative for cough and hemoptysis.   Cardiovascular: Negative for chest pain and orthopnea.  Gastrointestinal: Negative for abdominal pain.    DRUG ALLERGIES:  No Known Allergies  VITALS:  Blood pressure 110/58, pulse 77, temperature 97.6 F (36.4 C), temperature source Oral, resp. rate 16, height 5\' 8"  (1.727 m), weight 137 lb 6.4 oz (62.324 kg), last menstrual period 11/13/2014, SpO2 100 %.  I/O last 3 completed shifts: In: 720 [P.O.:720] Out: 0      PHYSICAL EXAMINATION:  Physical Exam  Constitutional: She is oriented to person, place, and time and well-developed, well-nourished, and in no distress.  Eyes: Pupils are equal, round, and reactive to light.  Cardiovascular: Normal rate and regular rhythm.   Pulmonary/Chest: Effort normal. No respiratory distress. She has no wheezes.  Neurological: She is oriented to person, place, and time.  Skin: Skin is warm and dry.  Psychiatric: Mood, memory, affect and judgment normal.   cxr shows tiny apical PTX, greatly compared to yesterday's films.   ASSESSMENT AND PLAN:   Tiny air leak on atrium seen Will keep on suction Cont ct Follow up cx and cont abx for now.  If cx negative unclear of etiology of fever and leukocytosis

## 2014-12-12 ENCOUNTER — Inpatient Hospital Stay: Payer: BLUE CROSS/BLUE SHIELD

## 2014-12-12 LAB — CULTURE, GROUP A STREP (THRC)

## 2014-12-12 NOTE — Progress Notes (Signed)
Patient ID: Judy Barker, female   DOB: [birth year], 19 y.o.   MRN: [id number]   Surgery  Wants to go home.  Filed Vitals:   12/11/14 1322 12/11/14 2027 12/12/14 0625 12/12/14 1235  BP: 107/63 118/65 107/56 115/63  Pulse: 71 83 66 75  Temp: 97.9 F (36.6 C) 98.1 F (36.7 C) 98 F (36.7 C) 98 F (36.7 C)  TempSrc: Oral Oral Oral Oral  Resp: 16 16 20 16   Height:      Weight:      SpO2: 100% 100% 99% 100%     PE  Lungs clear, affect normal Very small air leak present on suction  CXR  Tiny apical PTX. Tube in good position  IMP left spontaneous ptx, tiny air leak, lung up   Plan: place to waterseal, if lung up Monday will discuss with Dr Thelma Bargeaks discharge home with heimlich valve.

## 2014-12-13 ENCOUNTER — Inpatient Hospital Stay: Payer: BLUE CROSS/BLUE SHIELD

## 2014-12-13 NOTE — Progress Notes (Signed)
19 yr old with spontaneous pneumothorax.  Discussed with patient and father that she still has an air leak with coughing at this time even on water seal.  Discussed the CXR with them as well, showing a small apical pneumothorax.   Filed Vitals:   12/13/14 0654  BP: 109/55  Pulse: 78  Temp: 98.5 F (36.9 C)  Resp: 18   I/O last 3 completed shifts: In: 240 [P.O.:240] Out: 5 [Chest Tube:5]    PE:  Gen: NAD Res: no respiratory distress, CT to water seal, air leak with cough  CBC Latest Ref Rng 12/11/2014 12/09/2014 12/07/2014  WBC 3.6 - 11.0 K/uL 16.3(H) 21.1(H) 16.0(H)  Hemoglobin 12.0 - 16.0 g/dL 11.9(L) 13.3 14.5  Hematocrit 35.0 - 47.0 % 35.9 39.9 42.8  Platelets 150 - 440 K/uL 230 239 344    CMP Latest Ref Rng 12/07/2014  Glucose 65 - 99 mg/dL 80  BUN 6 - 20 mg/dL 12  Creatinine 1.610.44 - 0.961.00 mg/dL 0.450.75  Sodium 409135 - 811145 mmol/L 139  Potassium 3.5 - 5.1 mmol/L 4.2  Chloride 101 - 111 mmol/L 105  CO2 22 - 32 mmol/L 27  Calcium 8.9 - 10.3 mg/dL 9.5    Imaging: CXR: IMPRESSION: Left chest tube in stable position. Stable tiny left apical Pneumothorax.  A/P:  19 yr old female with Left spontaneous pneumothorax still an air leak with coughing on water seal.  Would recommend continuing water seal for 48 hours after no air leak.  Discussed extensively with father and patient.    Later father came to nurse to tell her [facility], will attempt to arrange transfer.

## 2014-12-13 NOTE — Discharge Summary (Signed)
Physician Discharge Summary  Patient ID: Ladonna Sachs MRN: [id number] DOB/AGE: [birth year] 19 y.o.  Admit date: 12/07/2014 Discharge dEarl Manyate: 12/13/2014  Admission Diagnoses:  Discharge Diagnoses:  Active Problems:   Pneumothorax   Primary spontaneous pneumothorax   Discharged Condition: good  Hospital Course:: Ms. Gayleen OremOldham was admitted for spontaneous left pneumothorax.  She underwent tube thoracostomy.  Postoperatively she was noted to have persistent air leak, worse with forced expiration.  Also with low grade temperature and leukocytosis during early hospital course.  At time of discharge was tolerating diet and with good pain control with persistent air leak.  [facility] management of persistent air leak and per patient and patient family preference.  Consults: Internal medicine for low grade fevers/leukocytosis  Significant Diagnostic Studies: radiology: CXR: Large left sided pneumothorax, small apical ptx at discharge  Treatments: surgery:Tube thoracostomy  Discharge Exam: Blood pressure 124/65, pulse 80, temperature 97.8 F (36.6 C), temperature source Oral, resp. rate 16, height 5\' 8"  (1.727 m), weight 137 lb 6.4 oz (62.324 kg), last menstrual period 11/13/2014, SpO2 100 %. GEN: NAD/A&Ox3 CHEST: small air leak with forced expiration, some splinting  [facility]     Medication List    TAKE these medications        dexmethylphenidate 10 MG 24 hr capsule  Commonly known as:  FOCALIN XR  Take 1 capsule by mouth daily.     methylphenidate 18 MG CR tablet  Commonly known as:  CONCERTA  Take 1 tablet by mouth daily.         SignedIda Rogue: Brie Eppard 12/13/2014, 8:20 PM

## 2014-12-13 NOTE — Progress Notes (Signed)
Initial Nutrition Assessment       INTERVENTION:  Meals and snacks: Cater to pt preferences   NUTRITION DIAGNOSIS:    (none at this time) related to   as evidenced by  .    GOAL:   Patient will meet greater than or equal to 90% of their needs    MONITOR:    (Energy intake)  REASON FOR ASSESSMENT:   LOS    ASSESSMENT:      Pt admitted with spontaneous pneumothorax History reviewed. No pertinent past medical history.  Current Nutrition: tolerating meals per pt and eating well  Food/Nutrition-Related History: normal intake prior to admission   Scheduled Medications:  . sodium chloride  3 mL Intravenous Q12H        Electrolyte/Renal Profile and Glucose Profile:   Recent Labs Lab 12/07/14 2234  NA 139  K 4.2  CL 105  CO2 27  BUN 12  CREATININE 0.75  CALCIUM 9.5  GLUCOSE 80    Gastrointestinal Profile: Last BM:10/10     Weight Change: stable wt    Diet Order:  Diet regular Room service appropriate?: Yes; Fluid consistency:: Thin  Skin:   reviewed   Height:   Ht Readings from Last 1 Encounters:  12/08/14 5\' 8"  (1.727 m)    Weight:   Wt Readings from Last 1 Encounters:  12/08/14 137 lb 6.4 oz (62.324 kg)   BMI:  Body mass index is 20.9 kg/(m^2).   EDUCATION NEEDS:   No education needs identified at this time  LOW Care Level  Ta Fair B. Freida BusmanAllen, RD, LDN [phone] (pager)

## 2014-12-13 NOTE — Progress Notes (Signed)
Was asked to discuss [facility].  Patient father extremely dissatisfied with [facility] has gotten [facility] as well as the fact that we do not have the necessary CT surgical services to deal with persistent air leak after spontaneous ptx.  Discussed with transfer center and has been accepted by [facility] Kar[facility]Jacob Klapper[facility], Room [phone].  I have discussed this with family and have answered any questions that they may have.  They agree with this plan and appear satisfied with this situation.

## 2014-12-13 NOTE — Progress Notes (Signed)
Called [facility] to check on ambulance ETA. Stated it would be about 1.5 hr wait right now. Informed pt of status and she was fine.

## 2014-12-13 NOTE — Progress Notes (Signed)
Report given to Arvella MerlesBrook Barr RN [facility] will be [facility] to.  [facility] will be providing [facility] for pt stating it will be 1.5-2hr wait.  Pt and family notified of updates.  Waiting for transportation at this time.

## 2014-12-14 NOTE — Progress Notes (Signed)
[facility] with father and friend, in stable condition.

## 2014-12-15 LAB — CULTURE, BLOOD (ROUTINE X 2)
Culture: NO GROWTH
Culture: NO GROWTH

## 2016-04-18 IMAGING — CR DG CHEST 2V
2 series · 2 of 2 positions shown · non-contrast
Comparison: December 09, 2014.

CLINICAL DATA: Left-sided pneumothorax.

EXAM:
CHEST  2 VIEW

[chest pa]
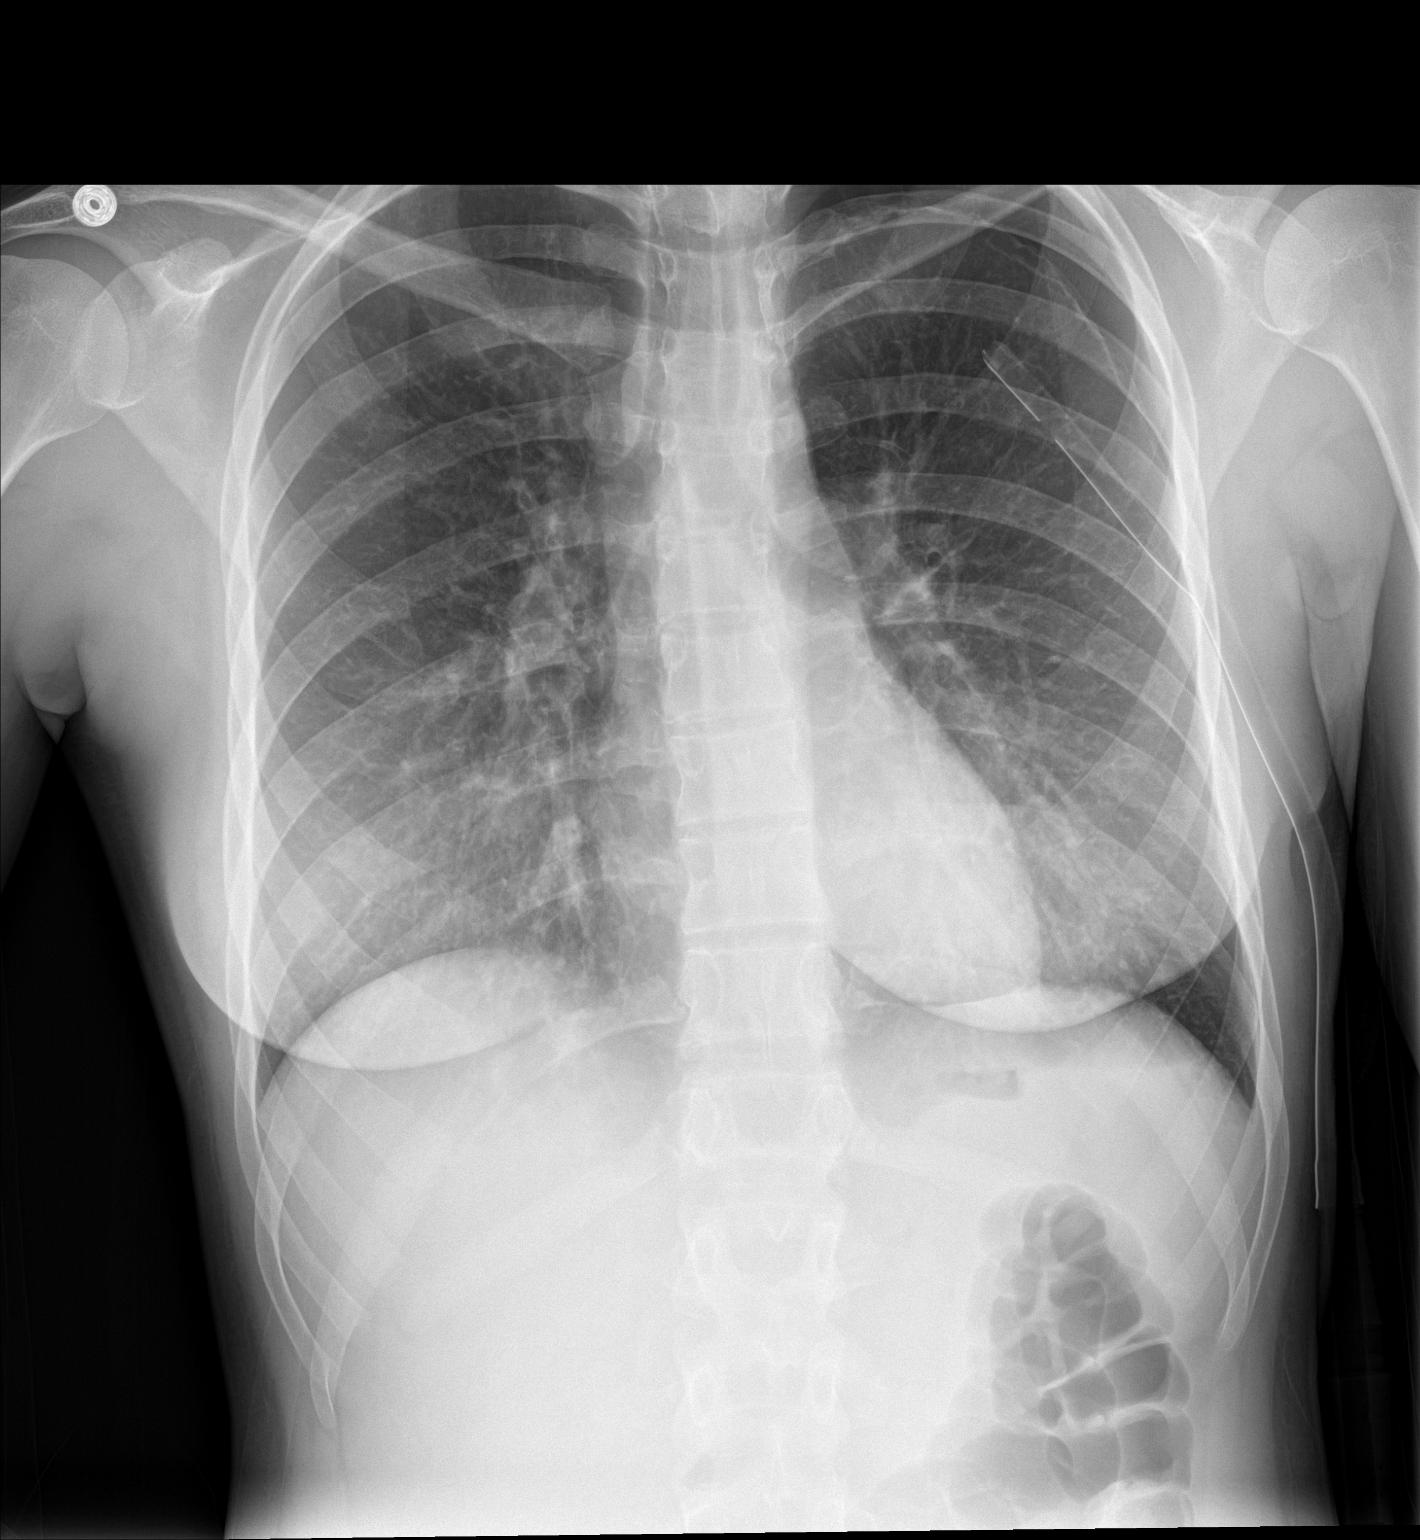

[chest lat]
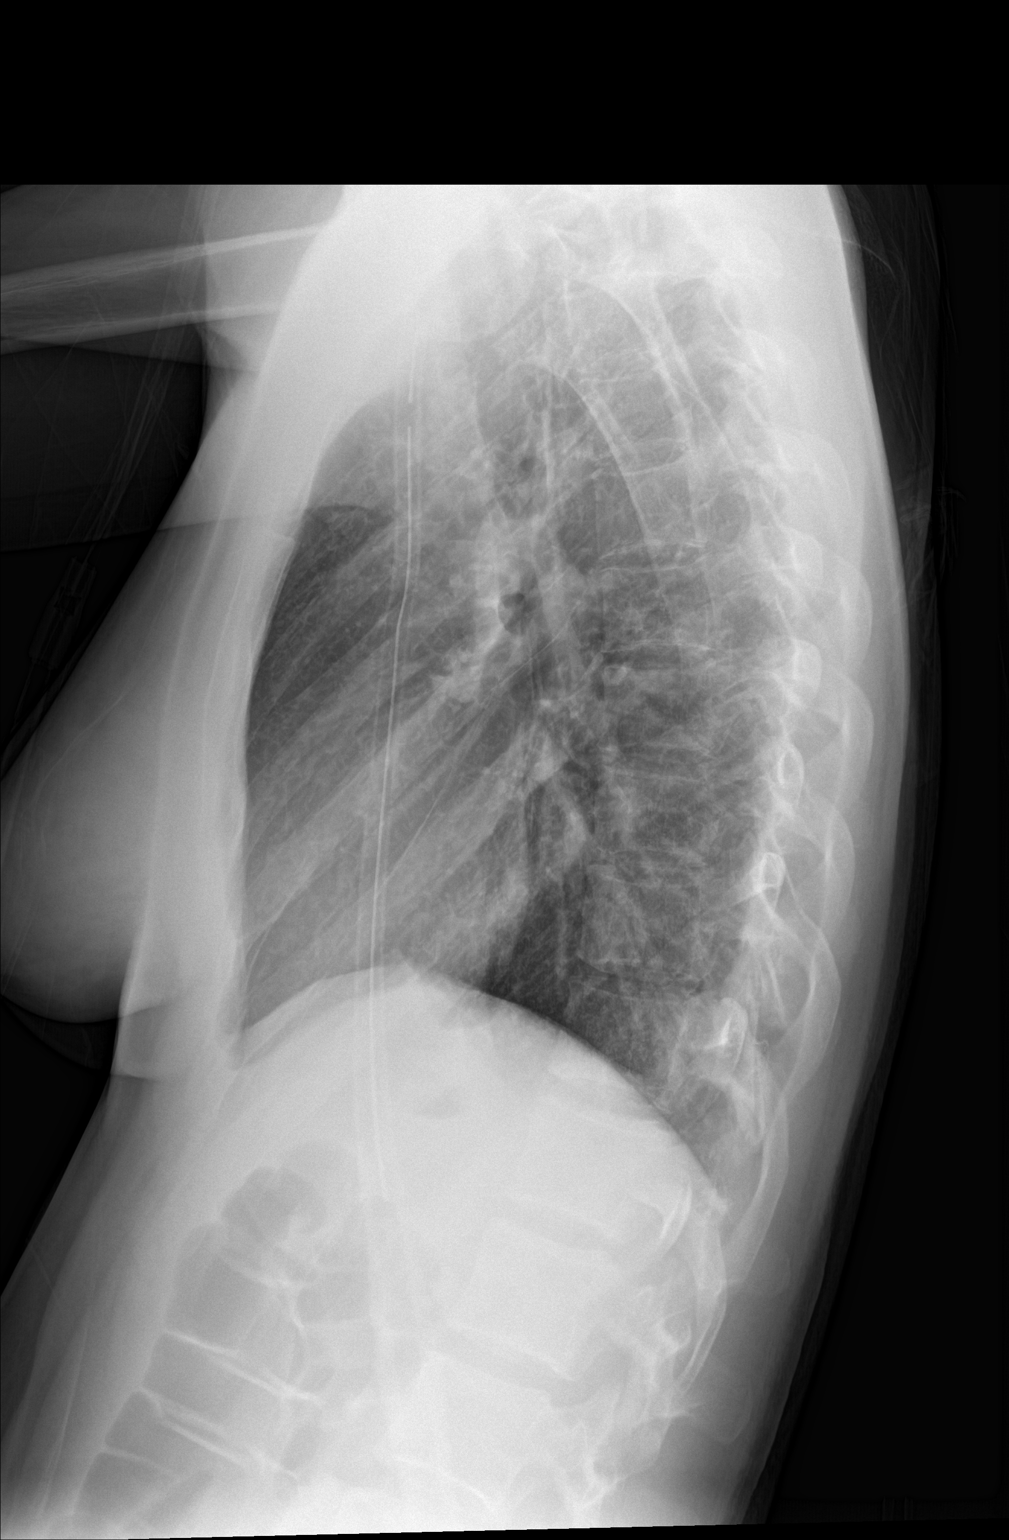

[2 of 2 positions shown; findings below may reference images not displayed]

FINDINGS: The heart size and mediastinal contours are within normal limits.
Right lung is clear. Left-sided chest tube is again noted and
unchanged in position. Mild left apical pneumothorax is noted which
is increased slightly in size. The visualized skeletal structures
are unremarkable.
IMPRESSION: Left-sided chest tube is unchanged in position. Left apical
pneumothorax has increased slightly in size since prior exam.

## 2016-04-19 IMAGING — CR DG CHEST 1V PORT
1 series · 1 of 1 positions shown · non-contrast
Comparison: 12/10/2014

CLINICAL DATA: Respiratory failure

EXAM:
PORTABLE CHEST 1 VIEW

[portable]
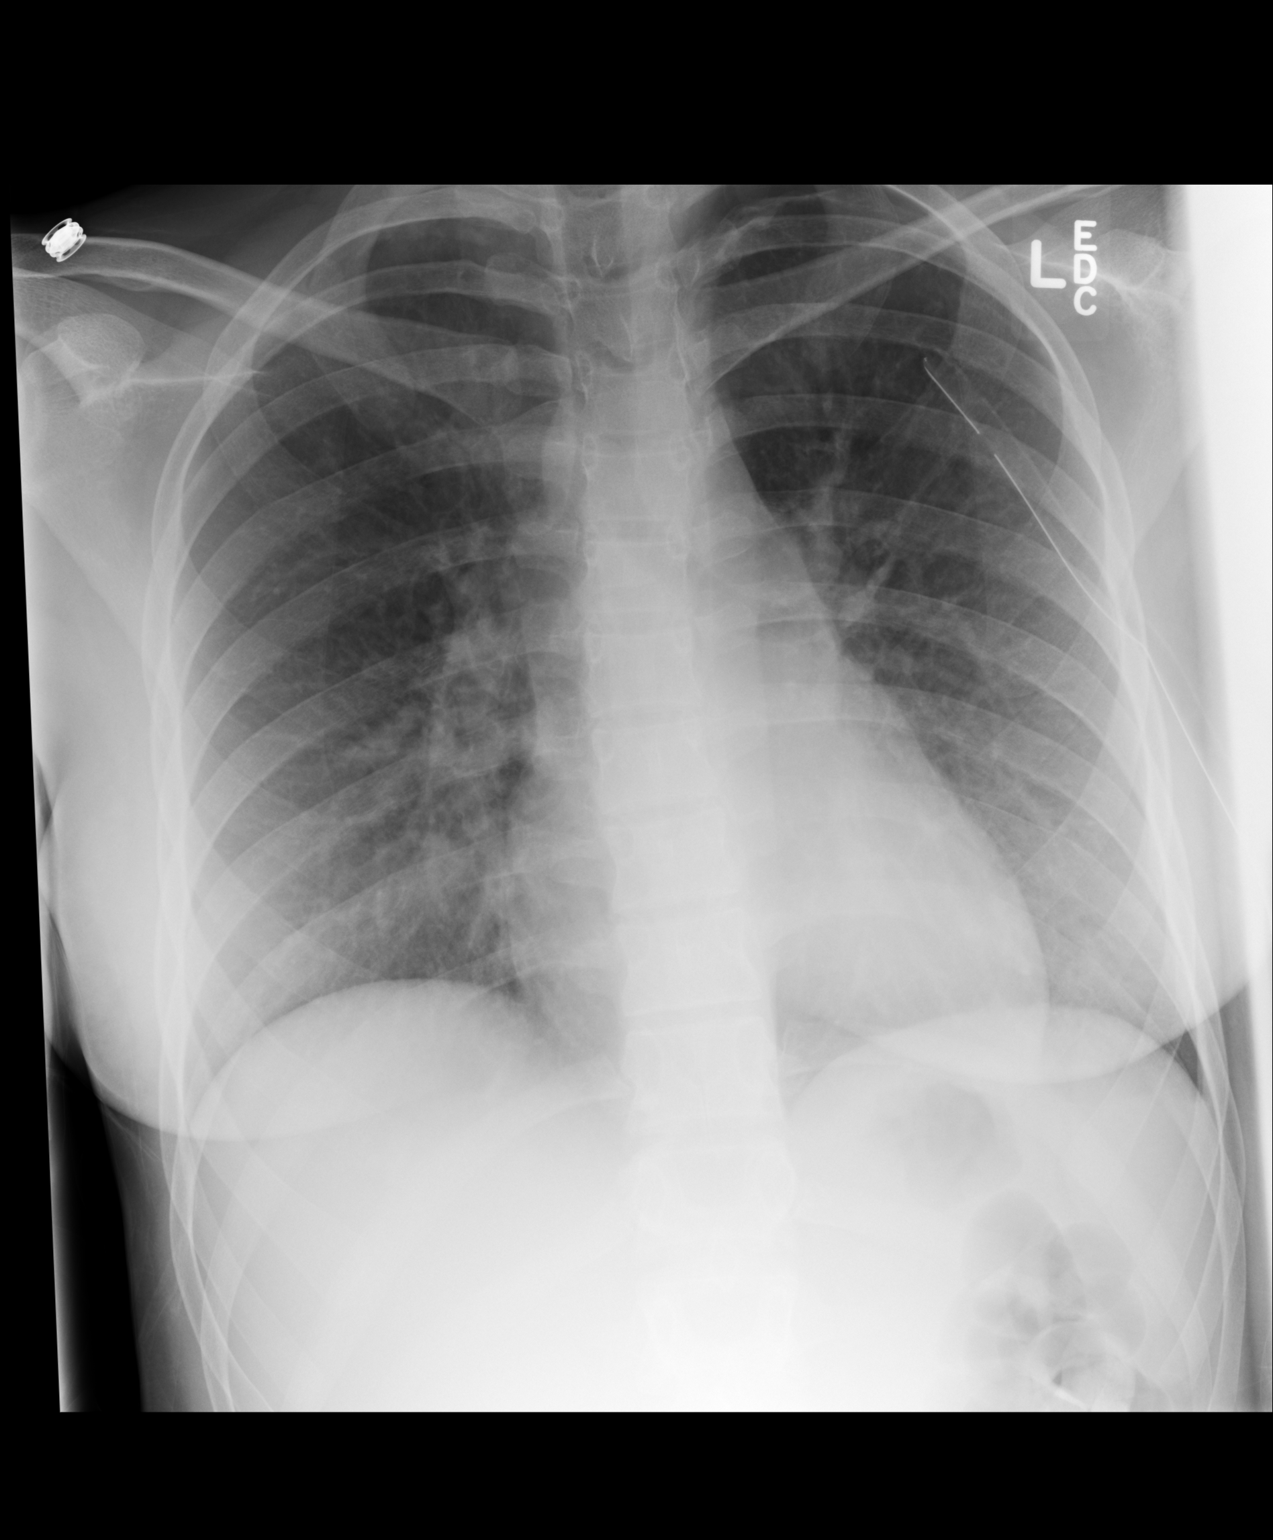

[1 of 1 positions shown; findings below may reference images not displayed]

FINDINGS: Tiny left apical pneumothorax, decreased.

Indwelling left chest tube.

Right lung is clear.  No pleural effusions.

The heart is normal in size.
IMPRESSION: Tiny left apical pneumothorax, decreased. Indwelling left chest
tube.

## 2016-04-21 IMAGING — CR DG CHEST 2V
2 series · 2 of 2 positions shown · non-contrast
Comparison: 12/12/2014.

CLINICAL DATA: Pneumothorax.

EXAM:
CHEST  2 VIEW

[chest pa]
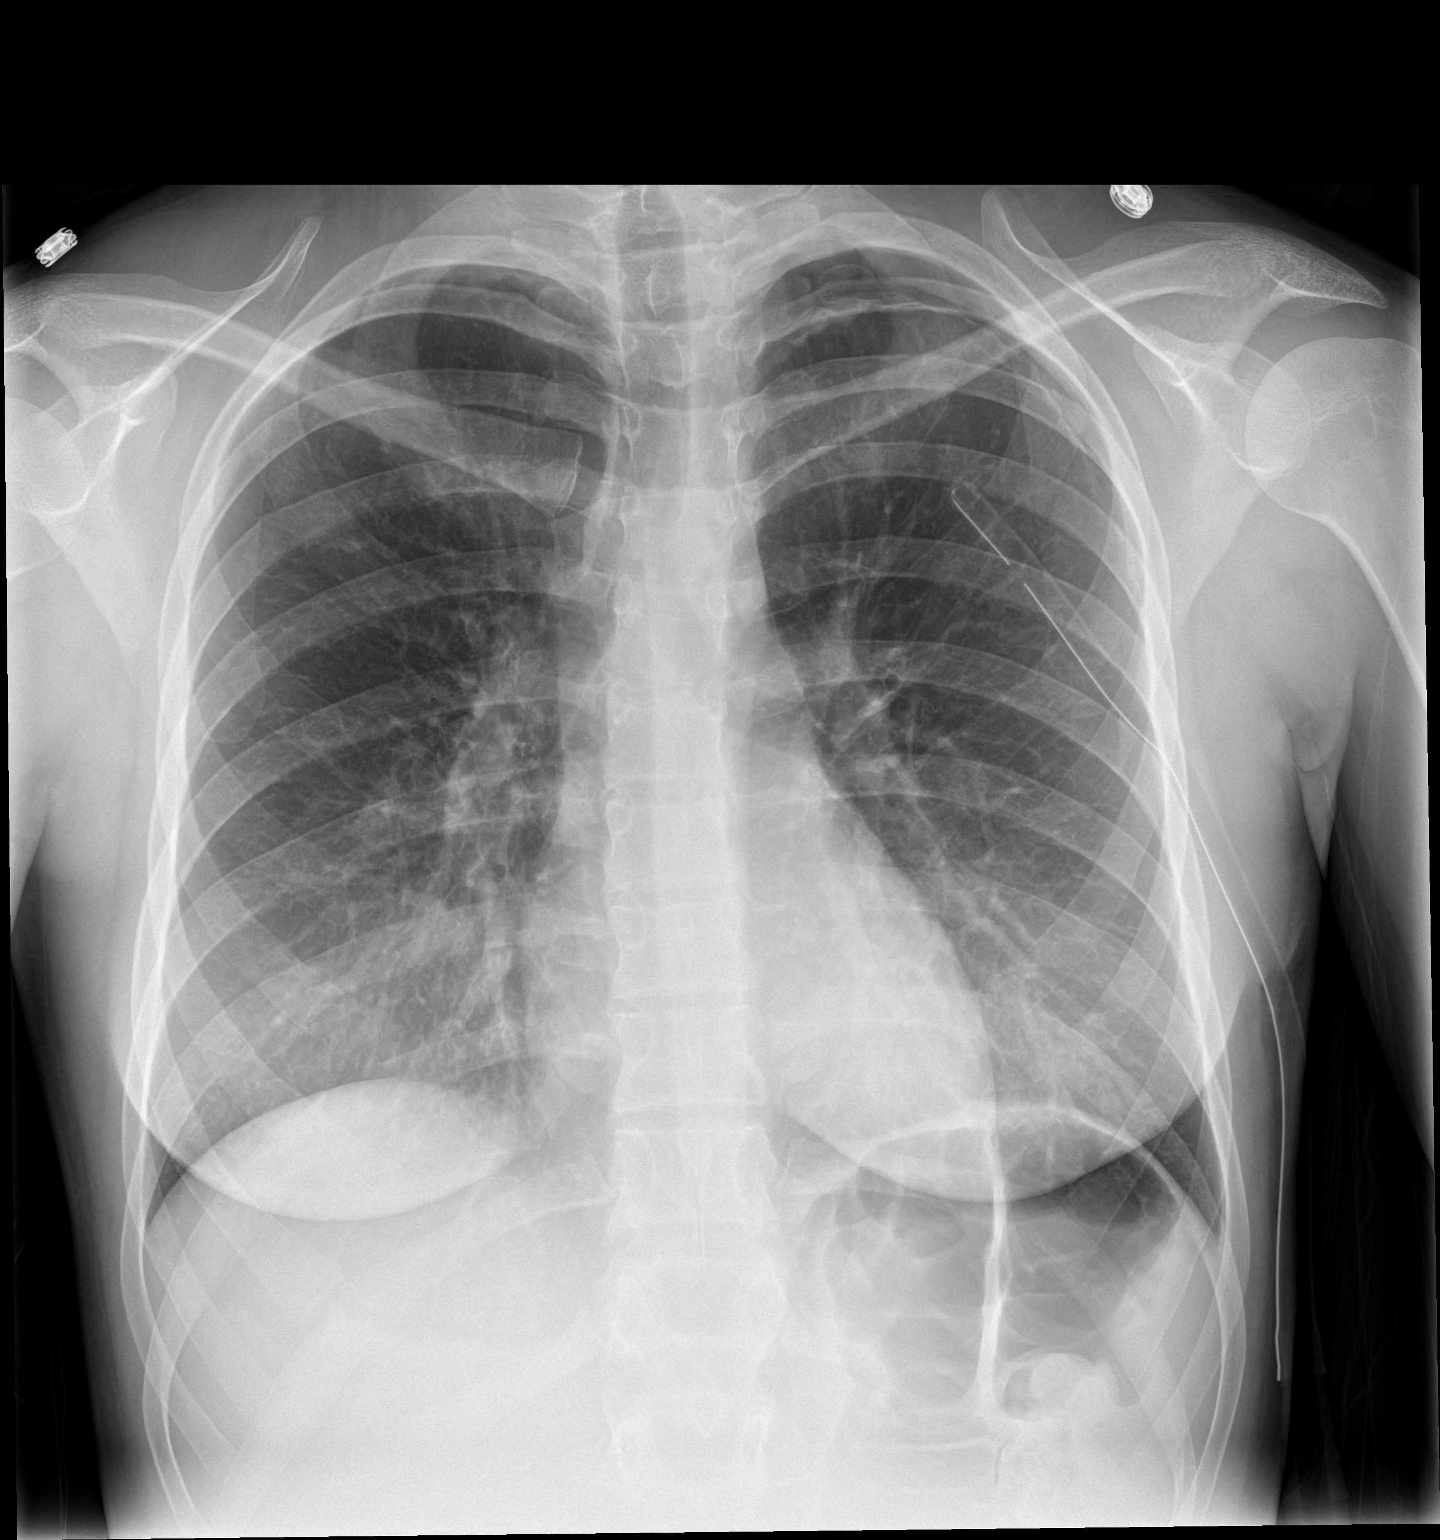

[chest lat]
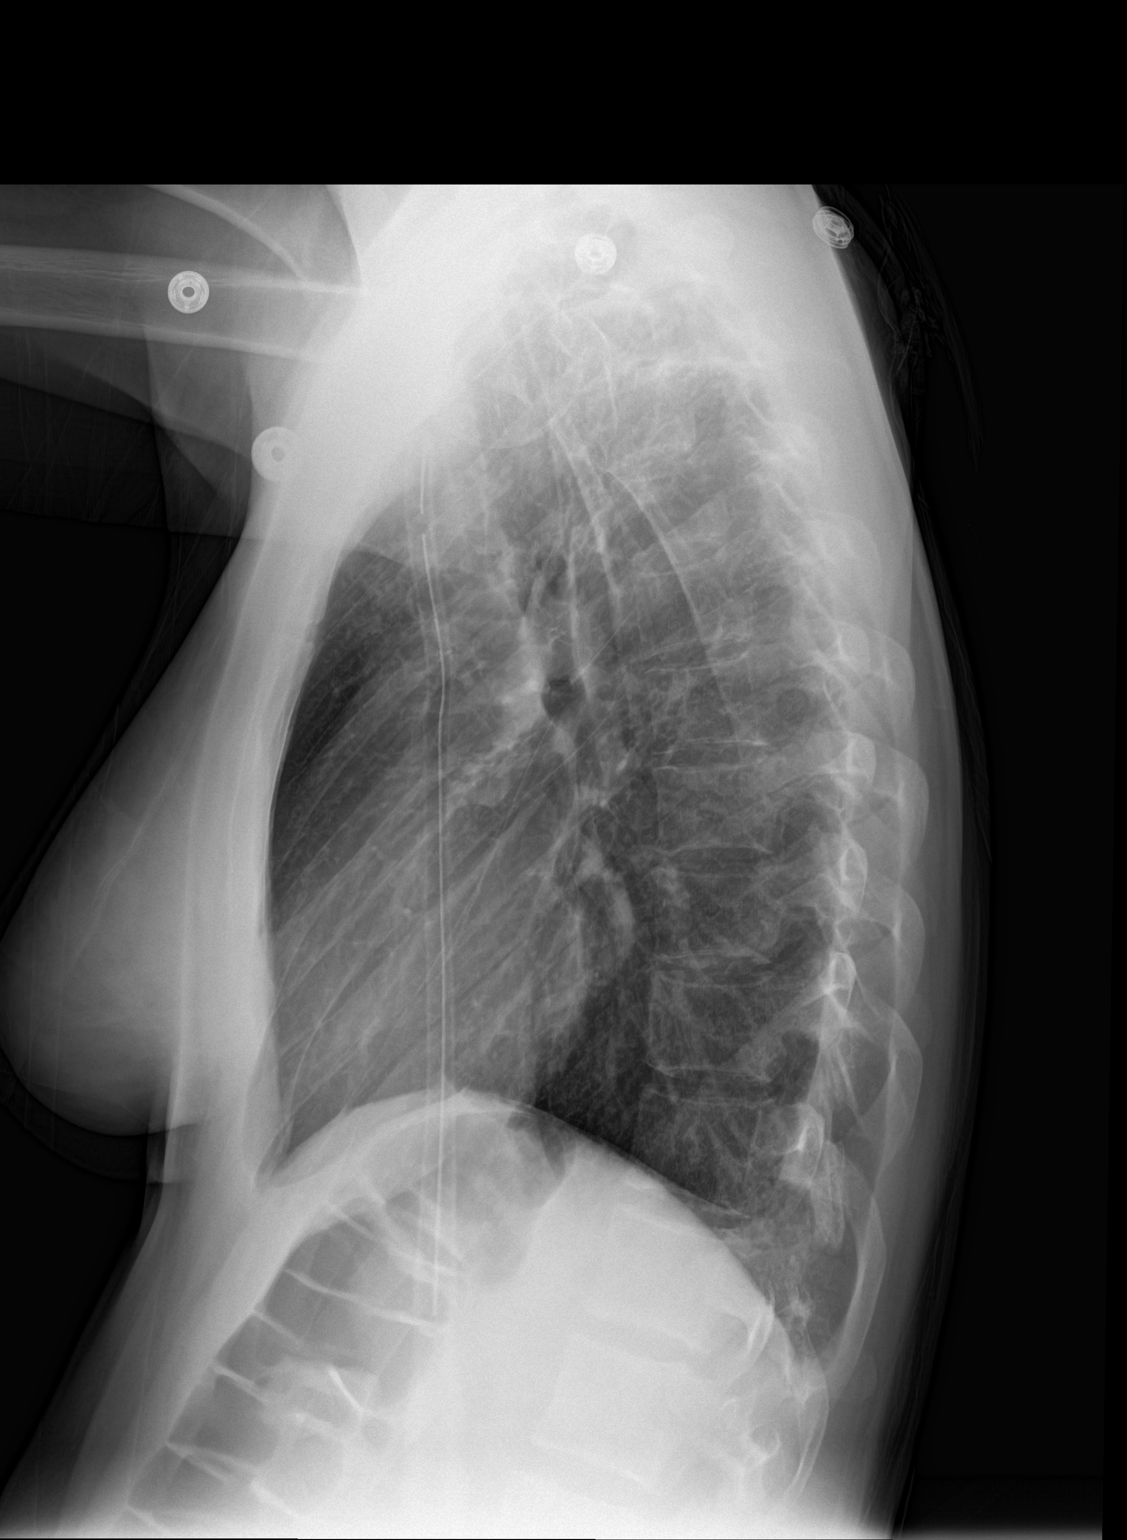

[2 of 2 positions shown; findings below may reference images not displayed]

FINDINGS: Left chest tube noted. Stable tiny left pneumothorax. Mediastinum
hilar structures normal. Low lung volumes with bibasilar
atelectasis. No pleural effusion or pneumothorax.
IMPRESSION: Left chest tube in stable position. Stable tiny left apical
pneumothorax.

## 2017-09-03 ENCOUNTER — Encounter: Payer: Self-pay | Admitting: Allergy

## 2017-09-03 ENCOUNTER — Ambulatory Visit: Payer: Self-pay | Admitting: Allergy

## 2017-09-04 ENCOUNTER — Encounter: Payer: Self-pay | Admitting: Allergy

## 2017-09-19 ENCOUNTER — Ambulatory Visit: Payer: 59

## 2017-09-23 ENCOUNTER — Ambulatory Visit: Payer: 59

## 2018-04-25 ENCOUNTER — Encounter: Payer: Self-pay | Admitting: *Deleted

## 2018-04-25 NOTE — Progress Notes (Signed)
This encounter was created in error - please disregard.

## 2018-05-22 ENCOUNTER — Ambulatory Visit: Payer: Self-pay | Admitting: *Deleted

## 2018-08-11 ENCOUNTER — Ambulatory Visit: Payer: Self-pay
